# Patient Record
Sex: Male | Born: 1995 | Race: Black or African American | Hispanic: No | Marital: Single
Health system: Southern US, Community
[De-identification: ages and names within clinical notes are randomized; demographics above are authoritative.]

---

## 2017-12-22 ENCOUNTER — Other Ambulatory Visit: Payer: Self-pay

## 2017-12-22 ENCOUNTER — Emergency Department: Payer: Self-pay

## 2017-12-22 ENCOUNTER — Emergency Department
Admission: EM | Admit: 2017-12-22 | Discharge: 2017-12-22 | Disposition: A | Discharge status: Home / Self Care | Payer: Self-pay | Attending: Emergency Medicine | Admitting: Emergency Medicine

## 2017-12-22 ENCOUNTER — Encounter: Payer: Self-pay | Admitting: Physician Assistant

## 2017-12-22 DIAGNOSIS — N5082 Scrotal pain: Secondary | ICD-10-CM | POA: Insufficient documentation

## 2017-12-22 DIAGNOSIS — S3994XA Unspecified injury of external genitals, initial encounter: Secondary | ICD-10-CM

## 2017-12-22 DIAGNOSIS — N50819 Testicular pain, unspecified: Secondary | ICD-10-CM

## 2017-12-22 LAB — URINALYSIS, COMPLETE (UACMP) WITH MICROSCOPIC
BILIRUBIN URINE: NEGATIVE
Glucose, UA: NEGATIVE mg/dL
HGB URINE DIPSTICK: NEGATIVE
KETONES UR: 5 mg/dL — AB
Nitrite: NEGATIVE
PROTEIN: 30 mg/dL — AB
Specific Gravity, Urine: 1.024 (ref 1.005–1.030)
pH: 5 (ref 5.0–8.0)

## 2017-12-22 NOTE — ED Notes (Signed)
Patient transported to Ultrasound 

## 2017-12-22 NOTE — ED Provider Notes (Addendum)
[facility] Emergency Department Provider Note ____________________________________________  Time seen: 1902  I have reviewed the triage vital signs and the nursing notes.  HISTORY  Chief Complaint  Testicle Pain  HPI Philip Osborne is a 22 y.o. male presents to the ED via EMS, from a regional soccer game.  Patient reports he was inadvertently kicked in the groin during the game.  He describes he played for the duration of that quarter for about 5 additional minutes prior to half time.  He noted that once he went to the locker room and sat down he had increasing pain to the scrotum.  He denies any nausea, vomiting, or hematuria.  He also denies any significant swelling to the scrotum or testicles.  He presents now for further evaluation.  Patient admits he has not had the occasion to urinate since the injury.  History reviewed. No pertinent past medical history.  There are no active problems to display for this patient.  History reviewed. No pertinent surgical history.  Prior to Admission medications   Not on File   Allergies Patient has no known allergies.  No family history on file.  Social History Social History   Tobacco Use  . Smoking status: Not on file  Substance Use Topics  . Alcohol use: Not on file  . Drug use: Not on file    Review of Systems  Constitutional: Negative for fever. Cardiovascular: Negative for chest pain. Respiratory: Negative for shortness of breath. Gastrointestinal: Negative for abdominal pain, vomiting and diarrhea. Genitourinary: Negative for dysuria or hematuria. Testicular pain as above. Musculoskeletal: Negative for back pain. Skin: Negative for rash. Neurological: Negative for headaches, focal weakness or numbness. ____________________________________________  PHYSICAL EXAM:  VITAL SIGNS: ED Triage Vitals  Enc Vitals Group     BP 12/22/17 1857 120/71     Pulse Rate 12/22/17 1857 69     Resp 12/22/17 1857 19      Temp 12/22/17 1857 98 F (36.7 C)     Temp Source 12/22/17 1857 Oral     SpO2 12/22/17 1857 96 %     Weight 12/22/17 1858 183 lb (83 kg)     Height 12/22/17 1858  (1.727 m)     Head Circumference --      Peak Flow --      Pain Score 12/22/17 1858 5     Pain Loc --      Pain Edu? --      Excl. in GC? --     Constitutional: Alert and oriented. Well appearing and in no distress. Head: Normocephalic and atraumatic. Hematological/Lymphatic/Immunological: No inguinal lymphadenopathy. Cardiovascular: Normal rate, regular rhythm. Normal distal pulses. Respiratory: Normal respiratory effort. No wheezes/rales/rhonchi. Gastrointestinal: Soft and nontender. No distention. GU: Normal external genitalia.  Circumcised penis without any obvious deformity, edema, or lesions.  Patient with mild tenderness to palpation to the bilateral testicles.  No palpable hydroceles or nodules. Musculoskeletal: Nontender with normal range of motion in all extremities.  Neurologic:  Normal gait without ataxia. Normal speech and language. No gross focal neurologic deficits are appreciated. Skin:  Skin is warm, dry and intact. No rash noted. ____________________________________________   LABS (pertinent positives/negatives) Labs Reviewed  URINALYSIS, COMPLETE (UACMP) WITH MICROSCOPIC - Abnormal; Notable for the following components:      Result Value   Color, Urine YELLOW (*)    APPearance CLEAR (*)    Ketones, ur 5 (*)    Protein, ur 30 (*)    Leukocytes, UA  TRACE (*)    Bacteria, UA RARE (*)    All other components within normal limits  ____________________________________________   RADIOLOGY  Scotral Ultrasound/Doppler   IMPRESSION:  1. Negative for testicular mass or torsion.  2. Scattered microlithiasis left testis. Current literature suggests  that testicular microlithiasis is not a significant independent risk  factor for development of testicular carcinoma, and that follow up  imaging  is not warranted in the absence of other risk factors.  Monthly testicular self-examination and annual physical exams are  considered appropriate surveillance. If patient has other risk  factors for testicular carcinoma, then referral to Urology should be  considered. (Reference: DeCastro, et al.: A 5-Year Follow up Study  of Asymptomatic Men with Testicular Microlithiasis. J Urol 2008;  179:1420-1423.)  ____________________________________________  PROCEDURES  Procedures ____________________________________________  INITIAL IMPRESSION / ASSESSMENT AND PLAN / ED COURSE  DDX: hematoma, torsion, hydrocele  Patient with a reassuring exam following direct trauma to the groin. His ultrasound/doppler is negative for any acute findings. The patient is made aware of the incidental finding of microlithiasis on [place]. He will follow-up with his provider when he returns home.  ____________________________________________  FINAL CLINICAL IMPRESSION(S) / ED DIAGNOSES  Final diagnoses:  Pain in testicle due to trauma      Lissa Hoard, PA-C 12/22/17 2356    [address] Charlesetta Ivory, PA-C 12/22/17 2358    Jeanmarie Plant, MD 12/22/17 2358

## 2017-12-22 NOTE — Discharge Instructions (Addendum)
Your exam and ultrasound are negative for any serious testicular injury. You should wear protective gear during games. Follow-up with urology for ongoing symptoms. You should perform self-exam on the testicles, regularly.

## 2017-12-22 NOTE — ED Triage Notes (Signed)
Pt was kicked in the groin while playing soccer today. Pt reports he got kicked and then was able to still play for 5 minutes. When he sat on the bench, the pain increased in his testicles. +lower abdominal cramping. Pt denies any swelling to area. No other complaints.

## 2019-03-09 IMAGING — US US SCROTUM
1 series · 13 of 25 positions shown · non-contrast
Comparison: None.

CLINICAL DATA: Testicular pain after being kicked in the groin
during soccer

EXAM:
SCROTAL ULTRASOUND
DOPPLER ULTRASOUND OF THE TESTICLES
TECHNIQUE: Complete ultrasound examination of the testicles, epididymis, and
other scrotal structures was performed. Color and spectral Doppler
ultrasound were also utilized to evaluate blood flow to the
testicles.

[Series 1: us scrotum · 13 of 72 slices shown]
[im 1/72]
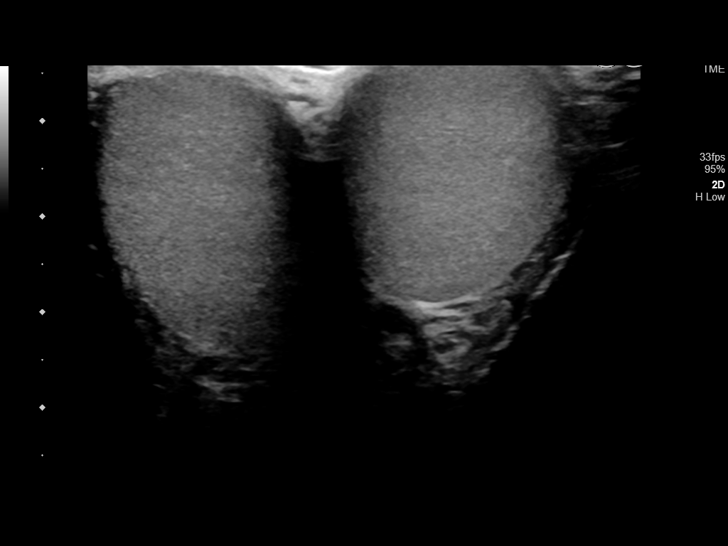
[im 6/72]
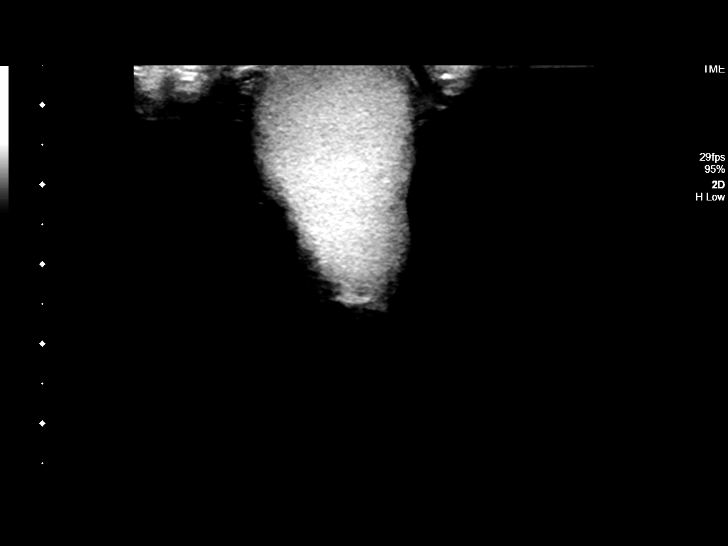
[im 12/72]
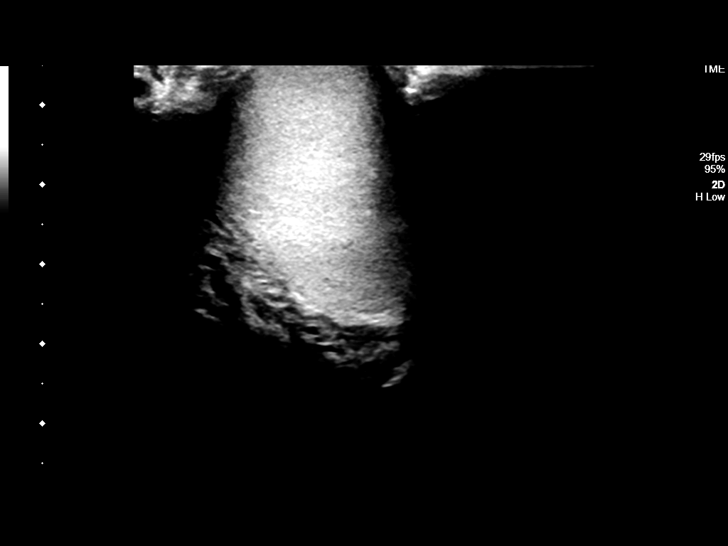
[im 18/72]
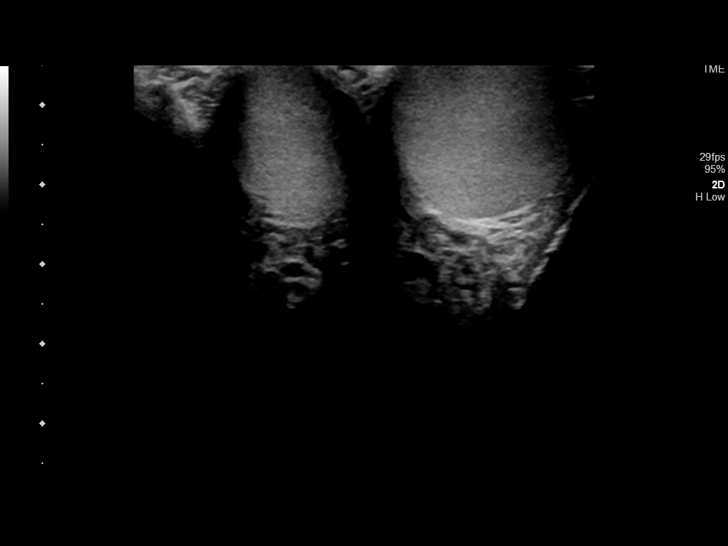
[im 24/72]
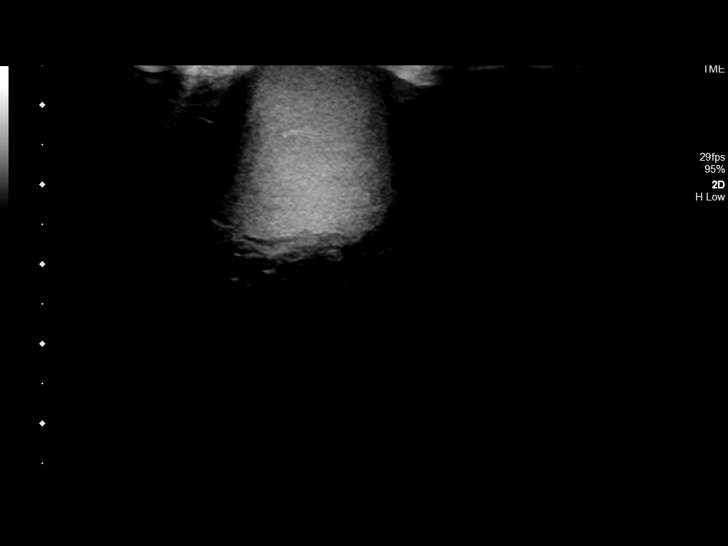
[im 30/72]
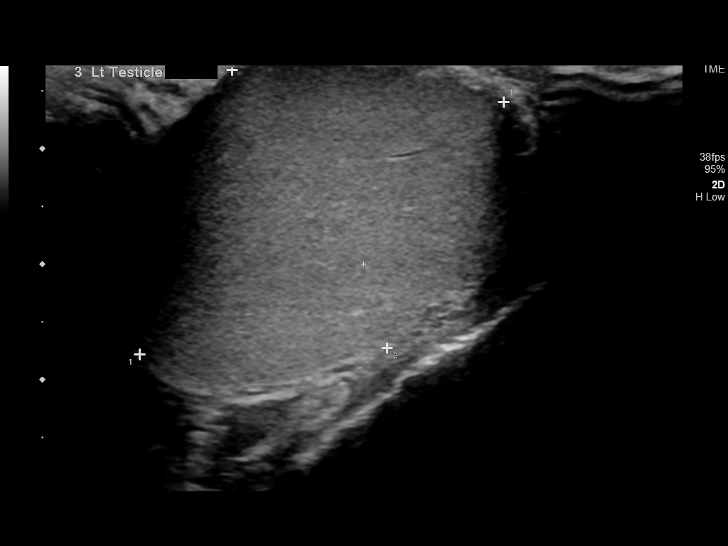
[im 36/72]
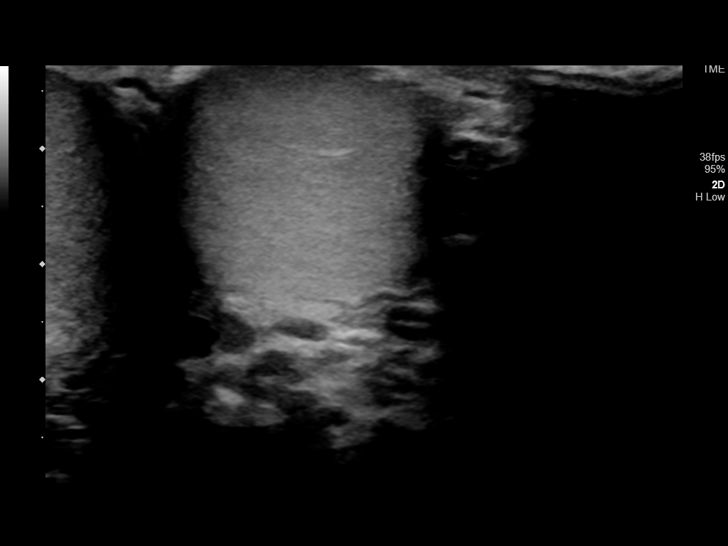
[im 42/72]
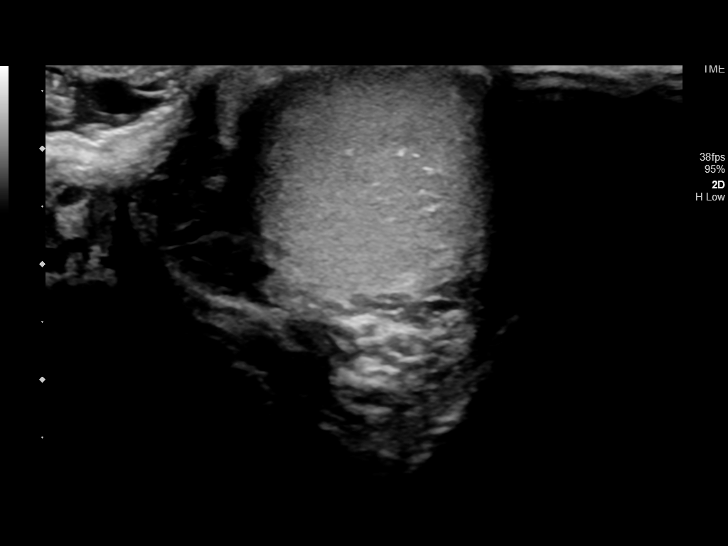
[im 48/72]
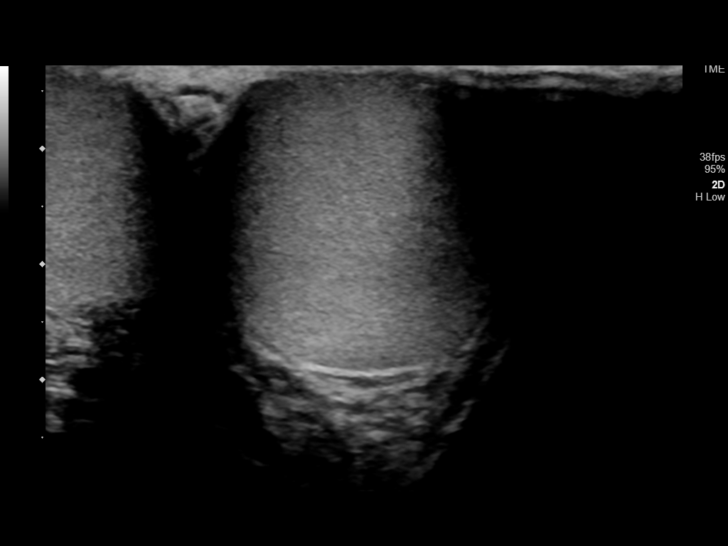
[im 54/72]
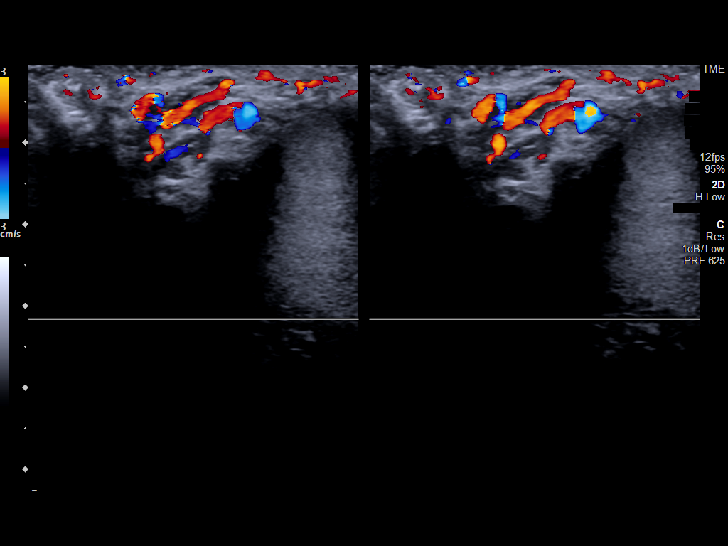
[im 60/72]
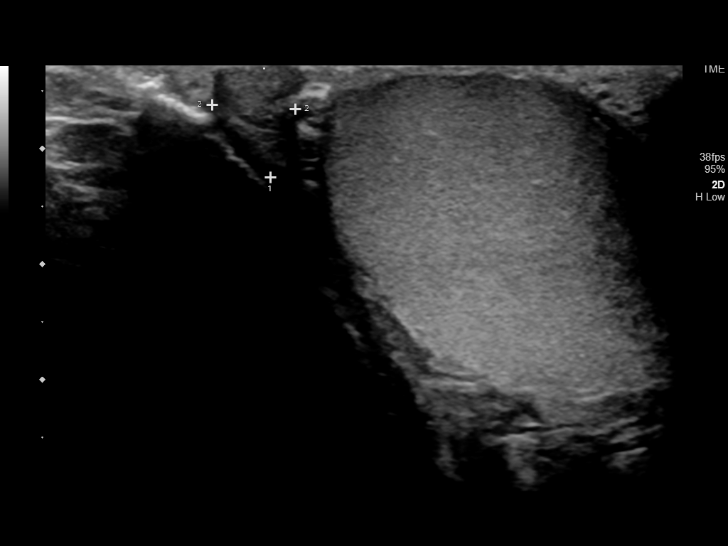
[im 66/72]
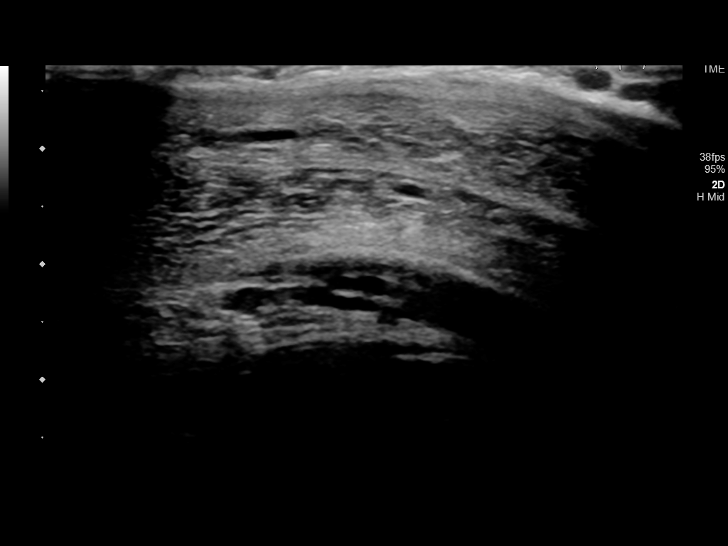
[im 72/72]
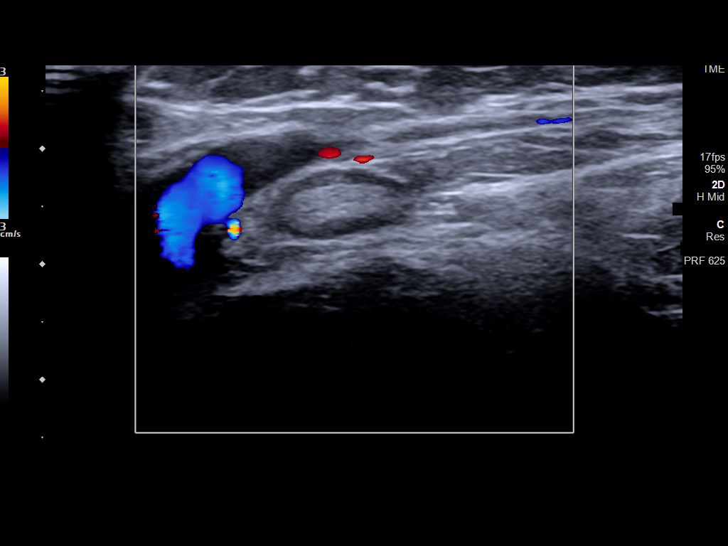

[13 of 25 positions shown; findings below may reference images not displayed]

FINDINGS: Right testicle

Measurements: 3.6 x 2.2 x 2.7 cm. No mass or microlithiasis
visualized.

Left testicle

Measurements: 3.8 x 2.8 x 2.7 cm. No mass. Scattered
microcalcification

Right epididymis:  Normal in size and appearance.

Left epididymis:  Normal in size and appearance.

Hydrocele:  None visualized.

Varicocele:  None visualized.

Pulsed Doppler interrogation of both testes demonstrates normal low
resistance arterial and venous waveforms bilaterally.
IMPRESSION: 1. Negative for testicular mass or torsion.
2. Scattered microlithiasis left testis. Current literature suggests
that testicular microlithiasis is not a significant independent risk
factor for development of testicular carcinoma, and that follow up
imaging is not warranted in the absence of other risk factors.
Monthly testicular self-examination and annual physical exams are
considered appropriate surveillance. If patient has other risk
factors for testicular carcinoma, then referral to Urology should be
considered. (Reference: Nya, et al.: A 5-Year Follow up Study
of Asymptomatic Men with Testicular Microlithiasis. J Urol 9006;
